# Patient Record
Sex: Female | Born: 1960 | Race: Black or African American | Hispanic: No | Marital: Single | Smoking: Current every day smoker
Health system: Southern US, Community
[De-identification: ages and names within clinical notes are randomized; demographics above are authoritative.]

## PROBLEM LIST (undated history)

## (undated) DIAGNOSIS — I10 Essential (primary) hypertension: Secondary | ICD-10-CM

## (undated) DIAGNOSIS — E119 Type 2 diabetes mellitus without complications: Secondary | ICD-10-CM

---

## 2010-03-30 ENCOUNTER — Ambulatory Visit (HOSPITAL_BASED_OUTPATIENT_CLINIC_OR_DEPARTMENT_OTHER)
Admission: RE | Admit: 2010-03-30 | Discharge: 2010-03-30 | Payer: Self-pay | Source: Home / Self Care | Attending: Unknown Physician Specialty | Admitting: Unknown Physician Specialty

## 2017-07-10 ENCOUNTER — Other Ambulatory Visit: Payer: Self-pay | Admitting: Internal Medicine

## 2017-07-10 DIAGNOSIS — N6489 Other specified disorders of breast: Secondary | ICD-10-CM

## 2017-07-13 ENCOUNTER — Ambulatory Visit
Admission: RE | Admit: 2017-07-13 | Discharge: 2017-07-13 | Disposition: A | Discharge status: Home / Self Care | Payer: Medicare Other | Source: Ambulatory Visit | Attending: Internal Medicine | Admitting: Internal Medicine

## 2017-07-13 DIAGNOSIS — N6489 Other specified disorders of breast: Secondary | ICD-10-CM

## 2019-06-24 IMAGING — MG STEREOTACTIC CORE NEEDLE BIOPSY
8 of 9 series · 8 of 17 positions shown · non-contrast
Comparison: Previous exams.

ADDENDUM:
Pathology revealed BENIGN LYMPH NODE of LEFT axilla. This was found
to be concordant by Dr. Sigfredo Minnis.

Pathology revealed COMPLEX SCLEROSING LESION of LEFT breast, upper
inner. This was found to be concordant by Dr. Sigfredo Minnis, with
excision recommended.
Pathology results will be discussed with the patient by telephone
and surgical referral will be made by Seppojuhani Malin, Breast
Navigator at [facility] [[email or web address]].
Pathology and imaging reports have been faxed to Seppojuhani Malin.
Pathology results reported by Andtzej Smirnow, RN on 07/16/2017.
CLINICAL DATA: Left-sided distortion
EXAM:
LEFT BREAST STEREOTACTIC CORE NEEDLE BIOPSY

[L (1 of 6)]
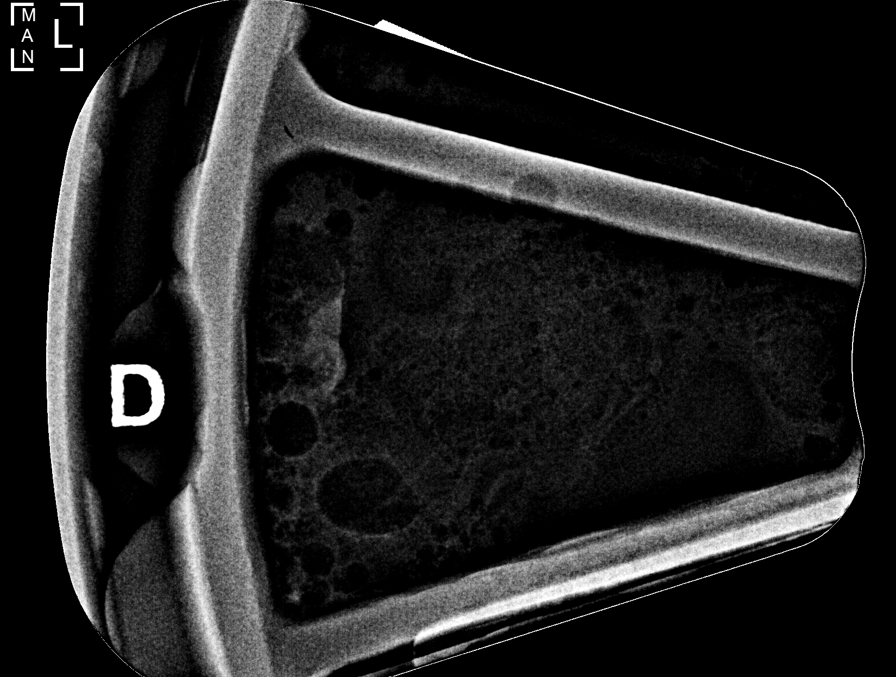

[L (2 of 6)]
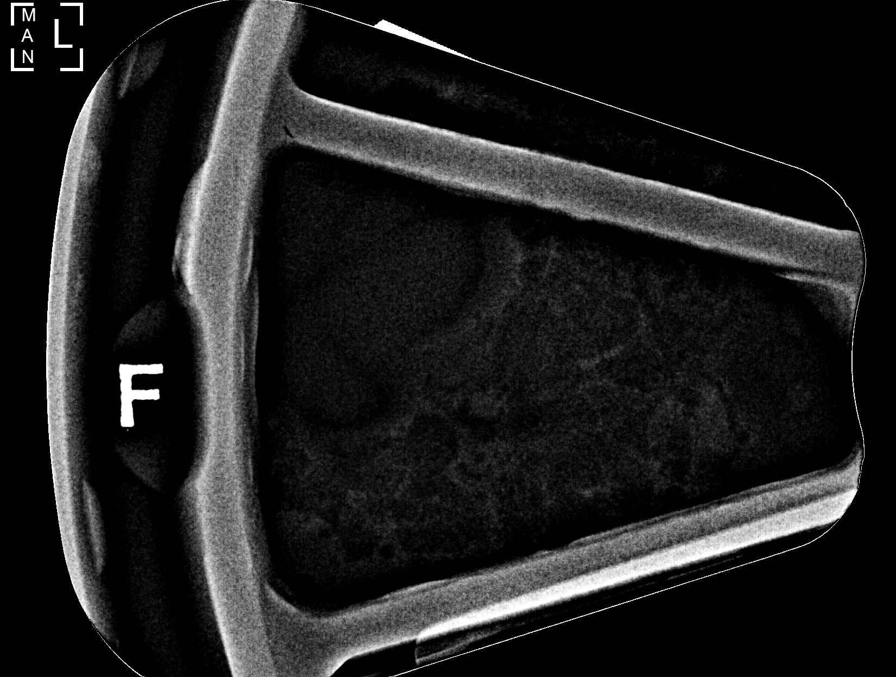

[L (3 of 6)]
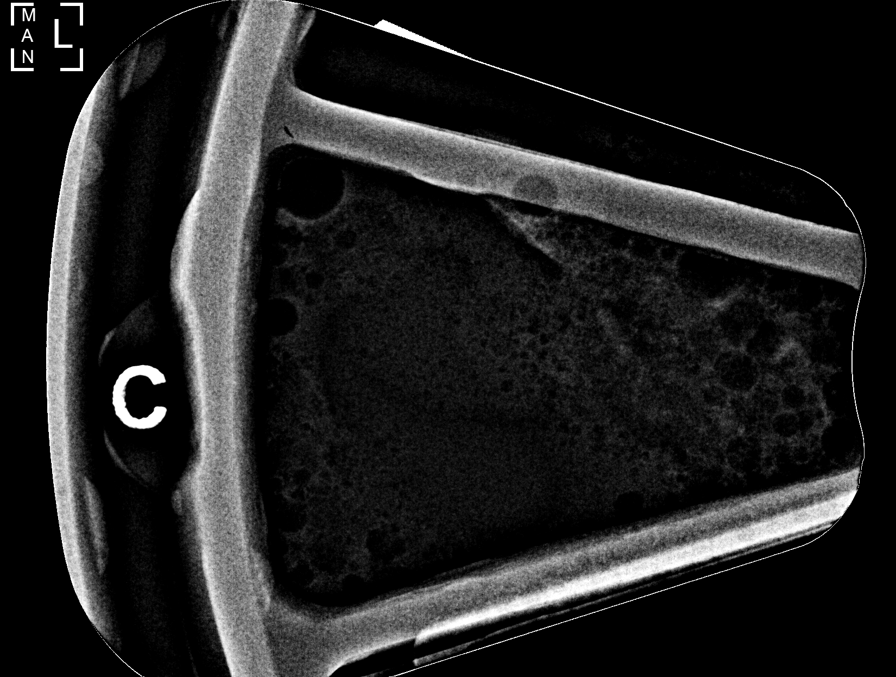

[L (4 of 6)]
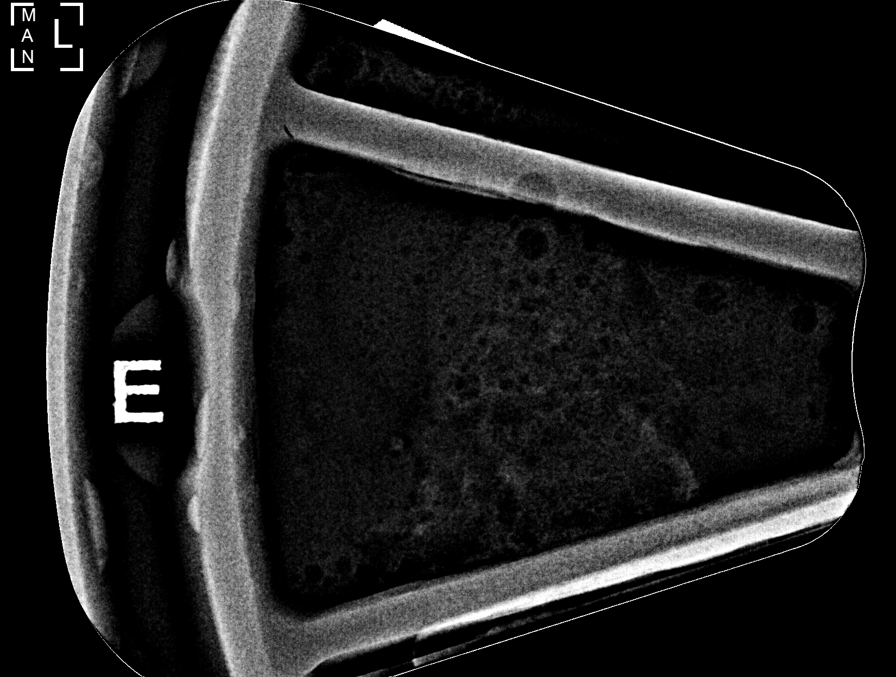

[L (5 of 6)]
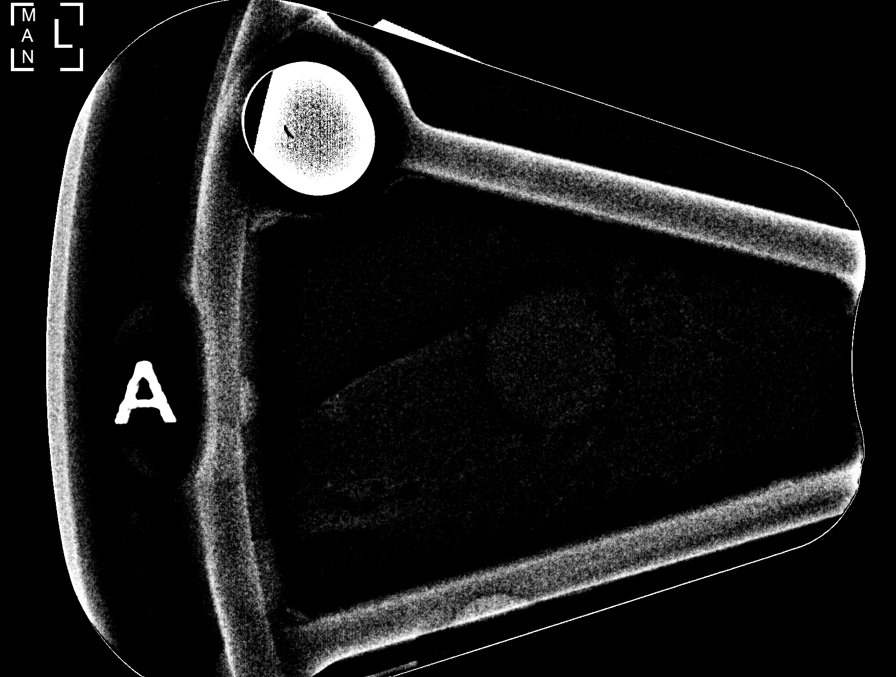

[L (6 of 6)]
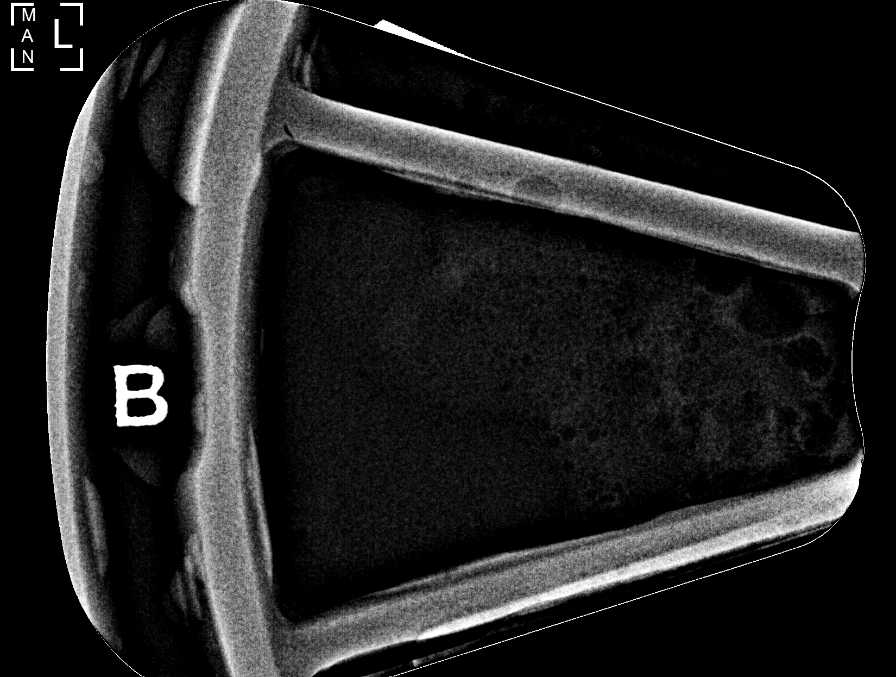

[L CC]
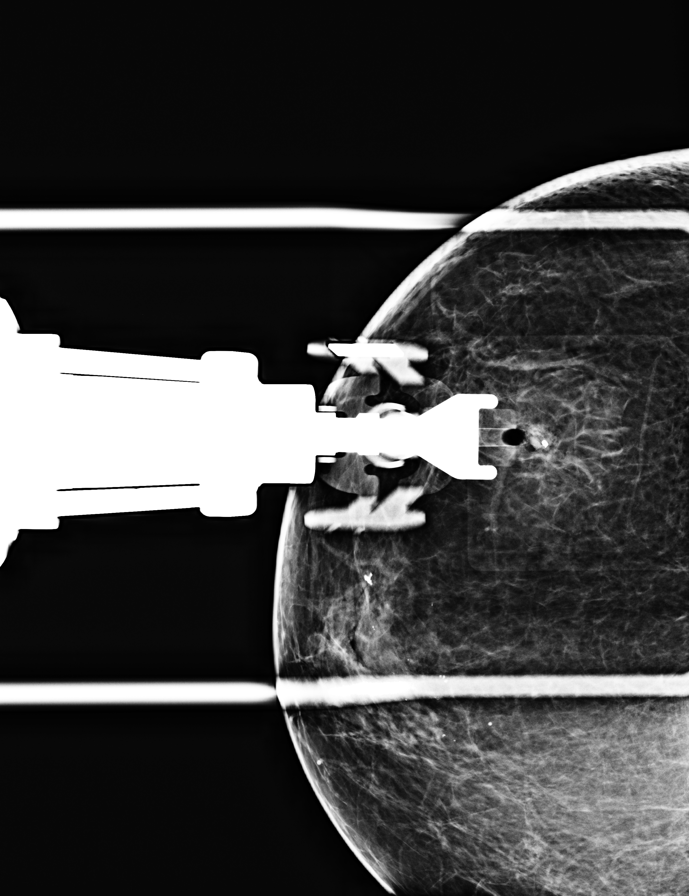

[L CC tomo · tomo slice 29/58.0]
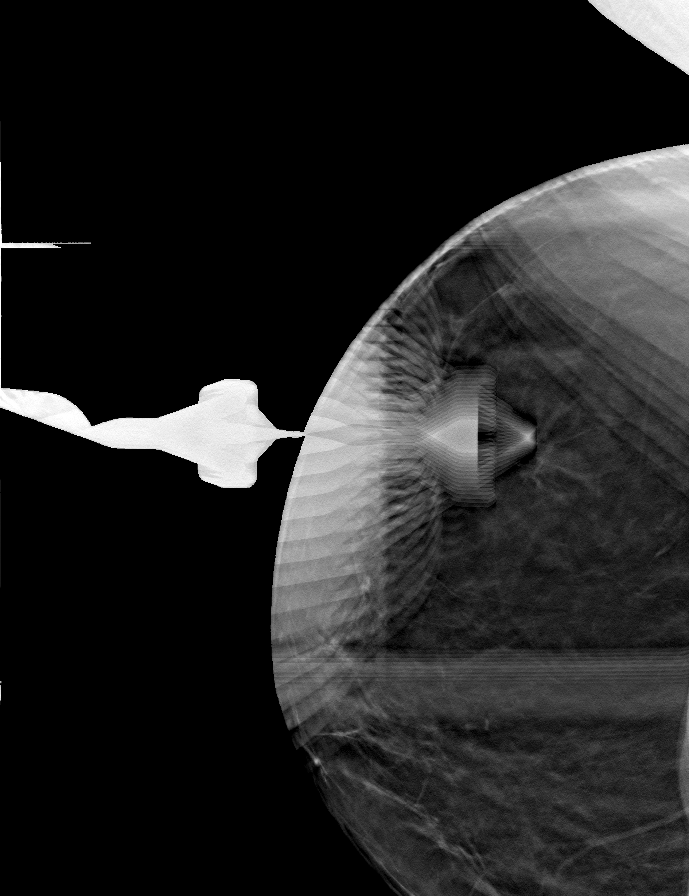

[8 of 17 positions shown; findings below may reference images not displayed]



Using sterile technique and 1% Lidocaine as local anesthetic, under
stereotactic guidance, a 9 gauge vacuum assisted device was used to
perform core needle biopsy of a linear branching asymmetry in the
left breast using a superior approach.

Lesion quadrant: Upper inner

At the conclusion of the procedure, a coil shaped tissue marker clip
was deployed into the biopsy cavity. Follow-up 2-view mammogram was
performed and dictated separately.
IMPRESSION: Stereotactic-guided biopsy of the linear branching asymmetry in the
upper inner left breast. No apparent complications.

## 2021-09-03 ENCOUNTER — Other Ambulatory Visit: Payer: Self-pay

## 2021-09-03 ENCOUNTER — Emergency Department (HOSPITAL_BASED_OUTPATIENT_CLINIC_OR_DEPARTMENT_OTHER): Payer: Medicare HMO

## 2021-09-03 ENCOUNTER — Encounter (HOSPITAL_BASED_OUTPATIENT_CLINIC_OR_DEPARTMENT_OTHER): Payer: Self-pay | Admitting: Emergency Medicine

## 2021-09-03 ENCOUNTER — Emergency Department (HOSPITAL_BASED_OUTPATIENT_CLINIC_OR_DEPARTMENT_OTHER)
Admission: EM | Admit: 2021-09-03 | Discharge: 2021-09-03 | Disposition: A | Discharge status: Home / Self Care | Payer: Medicare HMO | Attending: Emergency Medicine | Admitting: Emergency Medicine

## 2021-09-03 DIAGNOSIS — R509 Fever, unspecified: Secondary | ICD-10-CM | POA: Diagnosis not present

## 2021-09-03 DIAGNOSIS — R1084 Generalized abdominal pain: Secondary | ICD-10-CM | POA: Insufficient documentation

## 2021-09-03 DIAGNOSIS — R112 Nausea with vomiting, unspecified: Secondary | ICD-10-CM | POA: Insufficient documentation

## 2021-09-03 DIAGNOSIS — M791 Myalgia, unspecified site: Secondary | ICD-10-CM | POA: Insufficient documentation

## 2021-09-03 DIAGNOSIS — A599 Trichomoniasis, unspecified: Secondary | ICD-10-CM | POA: Diagnosis not present

## 2021-09-03 HISTORY — DX: Essential (primary) hypertension: I10

## 2021-09-03 HISTORY — DX: Type 2 diabetes mellitus without complications: E11.9

## 2021-09-03 LAB — URINALYSIS, ROUTINE W REFLEX MICROSCOPIC
Bilirubin Urine: NEGATIVE
Glucose, UA: NEGATIVE mg/dL
Ketones, ur: NEGATIVE mg/dL
Leukocytes,Ua: NEGATIVE
Nitrite: NEGATIVE
Protein, ur: >=300 mg/dL — AB
Specific Gravity, Urine: 1.025 (ref 1.005–1.030)
pH: 7.5 (ref 5.0–8.0)

## 2021-09-03 LAB — URINALYSIS, MICROSCOPIC (REFLEX)

## 2021-09-03 LAB — CBC WITH DIFFERENTIAL/PLATELET
Abs Immature Granulocytes: 0.05 10*3/uL (ref 0.00–0.07)
Basophils Absolute: 0 10*3/uL (ref 0.0–0.1)
Basophils Relative: 0 %
Eosinophils Absolute: 0 10*3/uL (ref 0.0–0.5)
Eosinophils Relative: 0 %
HCT: 45.4 % (ref 36.0–46.0)
Hemoglobin: 14.7 g/dL (ref 12.0–15.0)
Immature Granulocytes: 1 %
Lymphocytes Relative: 25 %
Lymphs Abs: 2 10*3/uL (ref 0.7–4.0)
MCH: 27.9 pg (ref 26.0–34.0)
MCHC: 32.4 g/dL (ref 30.0–36.0)
MCV: 86.3 fL (ref 80.0–100.0)
Monocytes Absolute: 0.2 10*3/uL (ref 0.1–1.0)
Monocytes Relative: 3 %
Neutro Abs: 5.8 10*3/uL (ref 1.7–7.7)
Neutrophils Relative %: 71 %
Platelets: 216 10*3/uL (ref 150–400)
RBC: 5.26 MIL/uL — ABNORMAL HIGH (ref 3.87–5.11)
RDW: 16.4 % — ABNORMAL HIGH (ref 11.5–15.5)
WBC: 8.1 10*3/uL (ref 4.0–10.5)
nRBC: 0 % (ref 0.0–0.2)

## 2021-09-03 LAB — COMPREHENSIVE METABOLIC PANEL
ALT: 15 U/L (ref 0–44)
AST: 19 U/L (ref 15–41)
Albumin: 4.3 g/dL (ref 3.5–5.0)
Alkaline Phosphatase: 63 U/L (ref 38–126)
Anion gap: 10 (ref 5–15)
BUN: 13 mg/dL (ref 8–23)
CO2: 23 mmol/L (ref 22–32)
Calcium: 9.3 mg/dL (ref 8.9–10.3)
Chloride: 103 mmol/L (ref 98–111)
Creatinine, Ser: 0.8 mg/dL (ref 0.44–1.00)
GFR, Estimated: >60 mL/min (ref >60)
Glucose, Bld: 157 mg/dL — ABNORMAL HIGH (ref 70–99)
Potassium: 3.7 mmol/L (ref 3.5–5.1)
Sodium: 136 mmol/L (ref 135–145)
Total Bilirubin: 0.7 mg/dL (ref 0.3–1.2)
Total Protein: 9 g/dL — ABNORMAL HIGH (ref 6.5–8.1)

## 2021-09-03 LAB — LIPASE, BLOOD: Lipase: 55 U/L — ABNORMAL HIGH (ref 11–51)

## 2021-09-03 MED ORDER — ONDANSETRON HCL 4 MG PO TABS: 4.0000 mg | ORAL_TABLET | Freq: Four times a day (QID) | ORAL | 0 refills | Status: AC

## 2021-09-03 MED ORDER — SODIUM CHLORIDE 0.9 % IV BOLUS
1000.0000 mL | Freq: Once | INTRAVENOUS | Status: AC
  Administered 2021-09-03: 1000 mL via INTRAVENOUS

## 2021-09-03 MED ORDER — METRONIDAZOLE 500 MG PO TABS
2000.0000 mg | ORAL_TABLET | Freq: Once | ORAL | Status: AC
  Administered 2021-09-03: 2000 mg via ORAL
  Filled 2021-09-03: qty 4

## 2021-09-03 MED ORDER — IOHEXOL 300 MG/ML  SOLN
100.0000 mL | Freq: Once | INTRAMUSCULAR | Status: AC | PRN
  Administered 2021-09-03: 100 mL via INTRAVENOUS

## 2021-09-03 MED ORDER — DOXYCYCLINE HYCLATE 100 MG PO TABS
100.0000 mg | ORAL_TABLET | Freq: Once | ORAL | Status: AC
  Administered 2021-09-03: 100 mg via ORAL
  Filled 2021-09-03: qty 1

## 2021-09-03 MED ORDER — DOXYCYCLINE HYCLATE 100 MG PO CAPS: 100.0000 mg | ORAL_CAPSULE | Freq: Two times a day (BID) | ORAL | 0 refills | Status: AC

## 2021-09-03 MED ORDER — FENTANYL CITRATE PF 50 MCG/ML IJ SOSY
50.0000 ug | PREFILLED_SYRINGE | Freq: Once | INTRAMUSCULAR | Status: AC
  Administered 2021-09-03: 50 ug via INTRAVENOUS
  Filled 2021-09-03: qty 1

## 2021-09-03 MED ORDER — CEFTRIAXONE SODIUM 500 MG IJ SOLR
500.0000 mg | Freq: Once | INTRAMUSCULAR | Status: AC
  Administered 2021-09-03: 500 mg via INTRAMUSCULAR
  Filled 2021-09-03: qty 500

## 2021-09-03 MED ORDER — ONDANSETRON HCL 4 MG/2ML IJ SOLN
4.0000 mg | Freq: Once | INTRAMUSCULAR | Status: AC
  Administered 2021-09-03: 4 mg via INTRAVENOUS
  Filled 2021-09-03: qty 2

## 2021-09-03 NOTE — Discharge Instructions (Signed)
Take next dose antibiotic tomorrow morning.  Take Zofran as needed for nausea and vomiting.

## 2021-09-03 NOTE — ED Notes (Signed)
Portable xray at bedside.

## 2021-09-03 NOTE — ED Triage Notes (Signed)
Pt arrives pov, to triage in wheelchair, c/o lower abdominal pain with n/v and chills since 0300 today

## 2021-09-03 NOTE — ED Provider Notes (Signed)
[facility] EMERGENCY DEPARTMENT Provider Note   CSN: [id number] Arrival date & time: 09/03/21  1501     History  Chief Complaint  Patient presents with   Abdominal Pain    Deborah Allen is a 61 y.o. female.  Here with lower abdominal pain for the last 12 hours with nausea and vomiting.  No diarrhea.  No pain with urination.  History of TAVR, heart block, she thinks cholecystectomy.  This nothing has made symptoms worse or better.  Denies any suspicious food intake.  Denies any chest pain or shortness of breath.  She has had fevers and chills and body aches.  She has not checked her temperature at home.  She has not taken any medicine for this.  She has not have any other issues at this time.  The history is provided by the patient.       Home Medications Prior to Admission medications   Not on File      Allergies    Statins and Penicillins    Review of Systems   Review of Systems  Physical Exam Updated Vital Signs BP (!) 190/101   Pulse 91   Temp 98.1 F (36.7 C) (Oral)   Resp (!) 21   Ht 5\' 4"  (1.626 m)   Wt 97.5 kg   SpO2 98%   BMI 36.90 kg/m  Physical Exam Vitals and nursing note reviewed.  Constitutional:      General: She is not in acute distress.    Appearance: She is well-developed. She is ill-appearing.  HENT:     Head: Normocephalic and atraumatic.     Mouth/Throat:     Mouth: Mucous membranes are moist.  Eyes:     Extraocular Movements: Extraocular movements intact.     Conjunctiva/sclera: Conjunctivae normal.     Pupils: Pupils are equal, round, and reactive to light.  Cardiovascular:     Rate and Rhythm: Normal rate and regular rhythm.     Heart sounds: Normal heart sounds. No murmur heard. Pulmonary:     Effort: Pulmonary effort is normal. No respiratory distress.     Breath sounds: Normal breath sounds.  Abdominal:     Palpations: Abdomen is soft.     Tenderness: There is abdominal tenderness in the right lower quadrant,  suprapubic area and left lower quadrant.  Musculoskeletal:        General: No swelling.     Cervical back: Neck supple.  Skin:    General: Skin is warm and dry.     Capillary Refill: Capillary refill takes less than 2 seconds.  Neurological:     Mental Status: She is alert.  Psychiatric:        Mood and Affect: Mood normal.     ED Results / Procedures / Treatments   Labs (all labs ordered are listed, but only abnormal results are displayed) Labs Reviewed  CBC WITH DIFFERENTIAL/PLATELET - Abnormal; Notable for the following components:      Result Value   RBC 5.26 (*)    RDW 16.4 (*)    All other components within normal limits  COMPREHENSIVE METABOLIC PANEL - Abnormal; Notable for the following components:   Glucose, Bld 157 (*)    Total Protein 9.0 (*)    All other components within normal limits  LIPASE, BLOOD - Abnormal; Notable for the following components:   Lipase 55 (*)    All other components within normal limits  URINALYSIS, ROUTINE W REFLEX MICROSCOPIC - Abnormal; Notable for  the following components:   Hgb urine dipstick SMALL (*)    Protein, ur >=300 (*)    All other components within normal limits  URINALYSIS, MICROSCOPIC (REFLEX) - Abnormal; Notable for the following components:   Bacteria, UA FEW (*)    Trichomonas, UA PRESENT (*)    All other components within normal limits  URINE CULTURE    EKG EKG Interpretation  Date/Time:  Saturday September 03 2021 15:30:09 EDT Ventricular Rate:  89 PR Interval:  214 QRS Duration: 155 QT Interval:  435 QTC Calculation: 530 R Axis:   93 Text Interpretation: Sinus rhythm Borderline prolonged PR interval Probable left atrial enlargement Left bundle branch block Confirmed by Virgina Norfolk ([id number]) on 09/03/2021 3:38:05 PM  Radiology CT ABDOMEN PELVIS W CONTRAST  Result Date: 09/03/2021 CLINICAL DATA:  Abdominal pain EXAM: CT ABDOMEN AND PELVIS WITH CONTRAST TECHNIQUE: Multidetector CT imaging of the abdomen and pelvis  was performed using the standard protocol following bolus administration of intravenous contrast. RADIATION DOSE REDUCTION: This exam was performed according to the departmental dose-optimization program which includes automated exposure control, adjustment of the mA and/or kV according to patient size and/or use of iterative reconstruction technique. CONTRAST:  OMNIPAQUE IOHEXOL 300 MG/ML  SOLN COMPARISON:  CT abdomen and pelvis dated September 15, 2020 FINDINGS: Lower chest: Small hiatal hernia.  No acute abnormality. Hepatobiliary: No focal liver abnormality is seen. No gallstones, gallbladder wall thickening, or biliary dilatation. Pancreas: Unremarkable. No pancreatic ductal dilatation or surrounding inflammatory changes. Spleen: Normal in size without focal abnormality. Adrenals/Urinary Tract: Unchanged left adrenal gland nodule measuring 2.0 cm which was low attenuation on prior noncontrast exam, compatible with benign adenoma. No hydronephrosis or nephrolithiasis. Bladder is unremarkable. Stomach/Bowel: Stomach is within normal limits. No evidence of bowel wall thickening, distention, or inflammatory changes. Vascular/Lymphatic: Aortic atherosclerosis. No enlarged abdominal or pelvic lymph nodes. Reproductive: No adnexal masses. Other: No abdominal wall hernia or abnormality. No abdominopelvic ascites. Musculoskeletal: Unchanged mild compression deformity of the L1 vertebral body. No aggressive appearing osseous lesions. IMPRESSION: 1. No acute findings in the abdomen or pelvis. 2.  Aortic Atherosclerosis (ICD10-I70.0). Electronically Signed   By: Allegra Lai M.D.   On: 09/03/2021 17:26   DG Chest Portable 1 View  Result Date: 09/03/2021 CLINICAL DATA:  Pt arrives pov, to triage in wheelchair, c/o lower abdominal pain with n/v and chills since 0300 today EXAM: PORTABLE CHEST 1 VIEW COMPARISON:  Chest x-ray 09/15/2020 FINDINGS: Left chest wall 2 lead pacemaker in grossly similar position. The heart  and mediastinal contours are unchanged. Aortic calcification. Aortic valve replacement. No focal consolidation. No pulmonary edema. No definite pleural effusion. No pneumothorax. No acute osseous abnormality. IMPRESSION: 1. No active disease. Consider lateral view for confirmation of no pleural effusion. 2.  Aortic Atherosclerosis (ICD10-I70.0). Electronically Signed   By: Tish Frederickson M.D.   On: 09/03/2021 15:58    Procedures Procedures    Medications Ordered in ED Medications  metroNIDAZOLE (FLAGYL) tablet 2,000 mg (has no administration in time range)  doxycycline (VIBRA-TABS) tablet 100 mg (has no administration in time range)  sodium chloride 0.9 % bolus 1,000 mL (0 mLs Intravenous Stopped 09/03/21 1653)  ondansetron (ZOFRAN) injection 4 mg (4 mg Intravenous Given 09/03/21 1535)  fentaNYL (SUBLIMAZE) injection 50 mcg (50 mcg Intravenous Given 09/03/21 1536)  iohexol (OMNIPAQUE) 300 MG/ML solution 100 mL (100 mLs Intravenous Contrast Given 09/03/21 1659)  cefTRIAXone (ROCEPHIN) injection 500 mg (500 mg Intramuscular Given 09/03/21 1803)    ED Course/  Medical Decision Making/ A&P                           Medical Decision Making Amount and/or Complexity of Data Reviewed Labs: ordered. Radiology: ordered.  Risk Prescription drug management.   Jhoselyn Ruffini is here with abdominal pain, fever, chills.  Patient also with nausea and vomiting.  Patient with elevated blood pressure but otherwise normal vitals.  Lower abdominal pain, nausea, vomiting for the last 12 hours.  Thinks she has had a history of gallbladder removal.  Has a history of diabetes, hypertension, TAVR, pacemaker.  EKG upon arrival shows left bundle branch with sinus rhythm.  No ischemic changes.  She denies any chest pain or shortness of breath.  Denies any suspicious food intake.  Denies any pain with urination.  Differential diagnosis is colitis versus less likely bowel obstruction, appendicitis, diverticulitis, UTI.   Could be foodborne illness or viral process.  We will get CBC, CMP, lipase, urinalysis, CT scan abdomen pelvis.  Will give IV fluids, IV fentanyl and IV Zofran and reevaluate.  Upon reevaluation she has no pain or nausea.  She is feeling better.  Per my review interpretation of labs there is no significant anemia, electrolyte abnormality, kidney injury, leukocytosis.  Urinalysis negative for infection.  However did see trichomonas in the urine.  Patient states that she has not had sex in 10 years.  She does not have any vaginal discharge.  Overall CT scan of the abdomen and pelvis shows no acute findings.  No abscess.  Overall, trichomonas is positive and will empirically treat for gonorrhea and chlamydia and trichomonas despite patient being fairly asymptomatic without discharge and without any recent sexual encounters.  She does have a low allergy to penicillin but seems like she does not have any contraindications to give a dose of Rocephin.  We will give her Rocephin here and then treat with doxycycline and Flagyl.  Will prescribe Zofran.  I have no concern for PID given no fever, no white count, no abnormalities on CT scan and overall no vaginal discharge or ongoing discomfort.  I did offer patient a pelvic exam to do swabs for gonorrhea and chlamydia and to further evaluate her cervix but she did not want that.  I think that is reasonable.  We will just do empiric treatment.  Have her follow-up with her primary care doctor.    Patient tolerated antibiotics well.  Discharged with prescription for doxycycline.  This chart was dictated using voice recognition software.  Despite best efforts to proofread,  errors can occur which can change the documentation meaning.         Final Clinical Impression(s) / ED Diagnoses Final diagnoses:  Generalized abdominal pain  Trichomonas infection    Rx / DC Orders ED Discharge Orders     None         Virgina Norfolk, DO 09/03/21 1833

## 2021-09-05 LAB — URINE CULTURE

## 2023-08-15 IMAGING — CT CT ABD-PELV W/ CM
2 of 5 series · 17 of 46 positions shown, 19 images · IV contrast (Omnipaque)
Comparison: CT abdomen and pelvis dated September 15, 2020

CLINICAL DATA: Abdominal pain

EXAM:
CT ABDOMEN AND PELVIS WITH CONTRAST
TECHNIQUE: Multidetector CT imaging of the abdomen and pelvis was performed
using the standard protocol following bolus administration of
intravenous contrast.

[Series 2: axial st · axial · 0.94mm/px · z∈[-351,+9]mm · 14 of 82 slices shown, 16 images]
[im 5/82  soft-tissue]
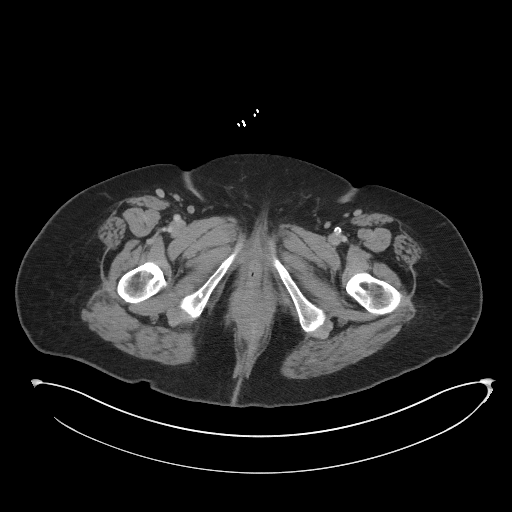
[im 5/82  bone]
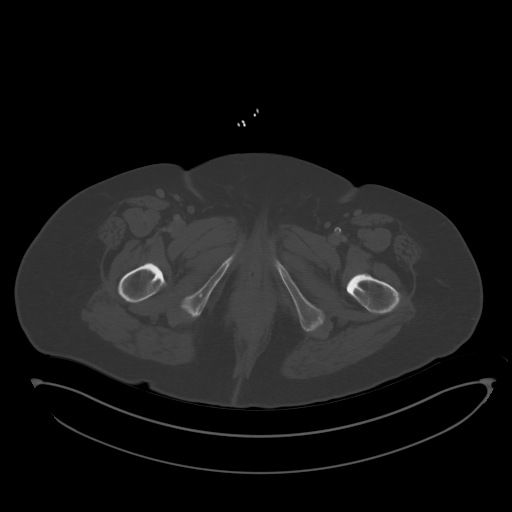
[im 13/82  soft-tissue]
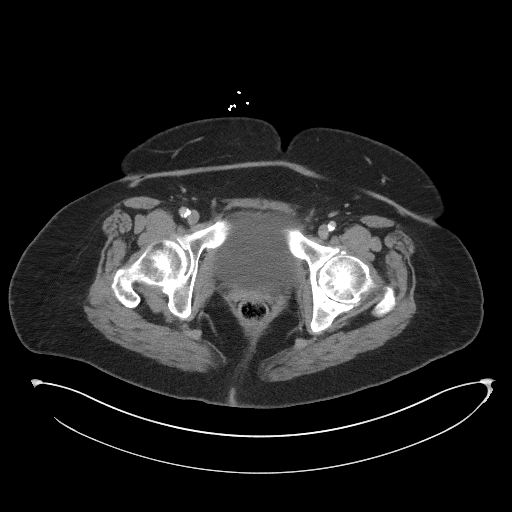
[im 17/82  soft-tissue]
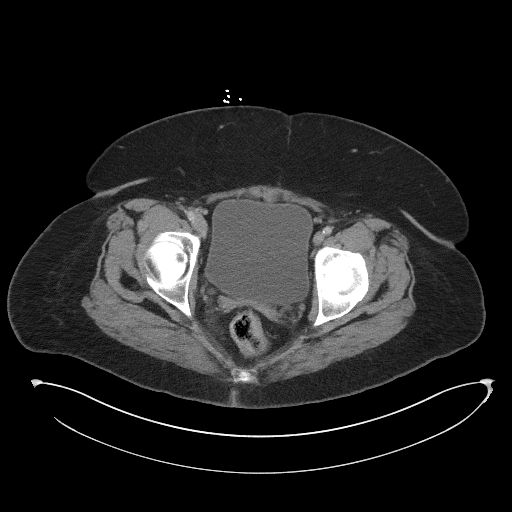
[im 21/82  soft-tissue]
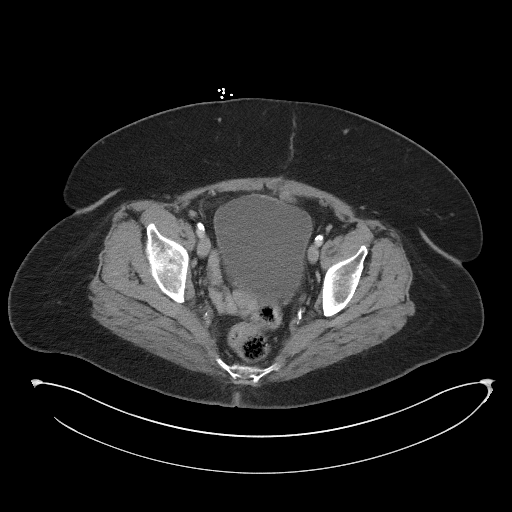
[im 29/82  soft-tissue]
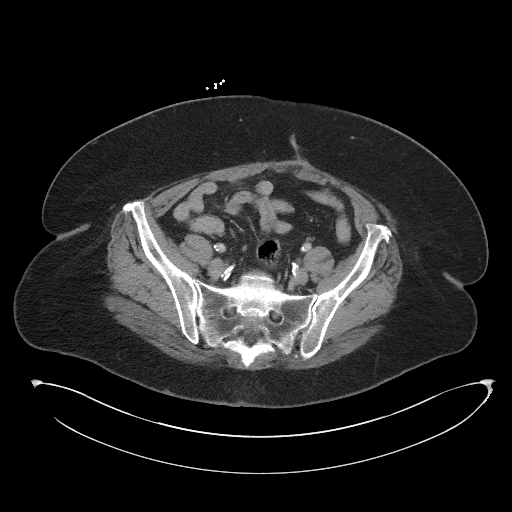
[im 33/82  soft-tissue]
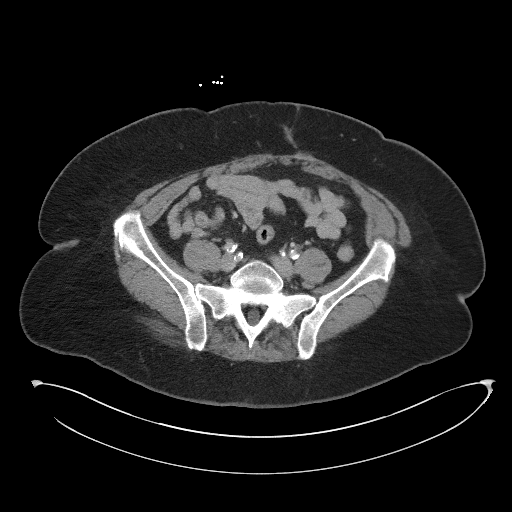
[im 37/82  soft-tissue]
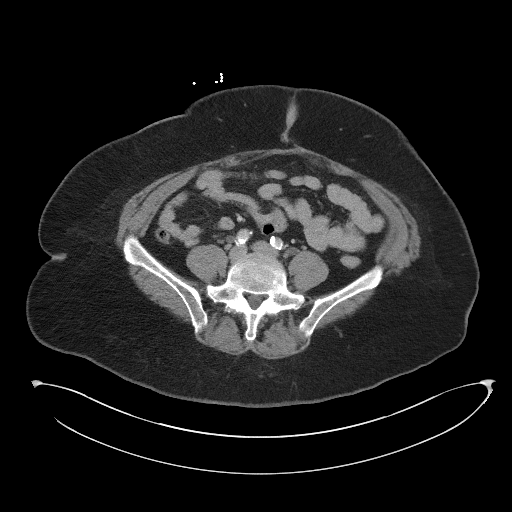
[im 45/82  soft-tissue]
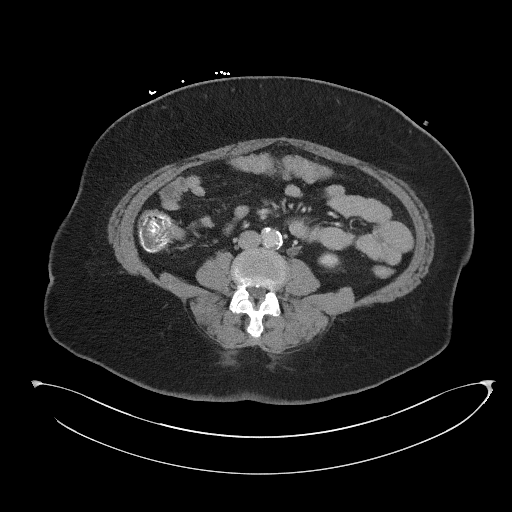
[im 49/82  soft-tissue]
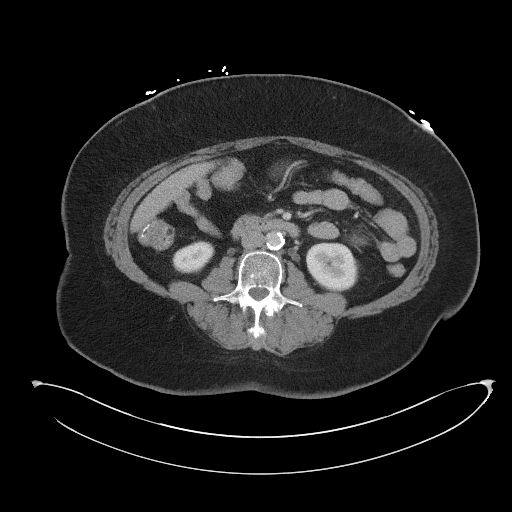
[im 49/82  bone]
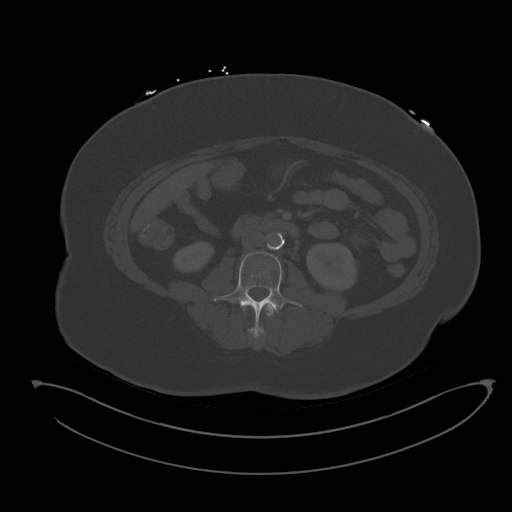
[im 53/82  soft-tissue]
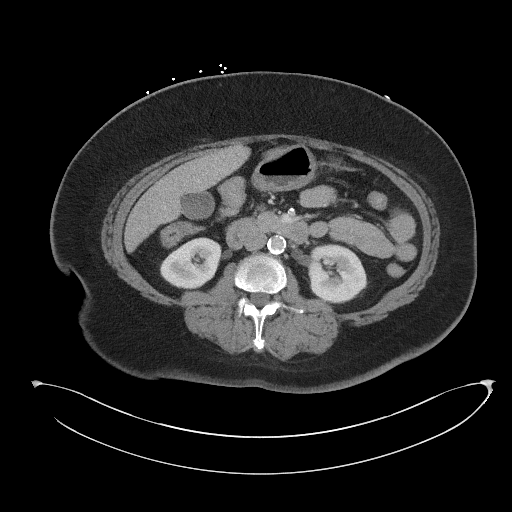
[im 61/82  soft-tissue]
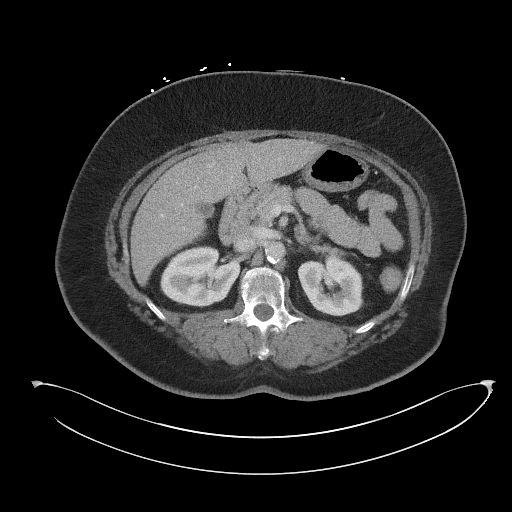
[im 65/82  soft-tissue]
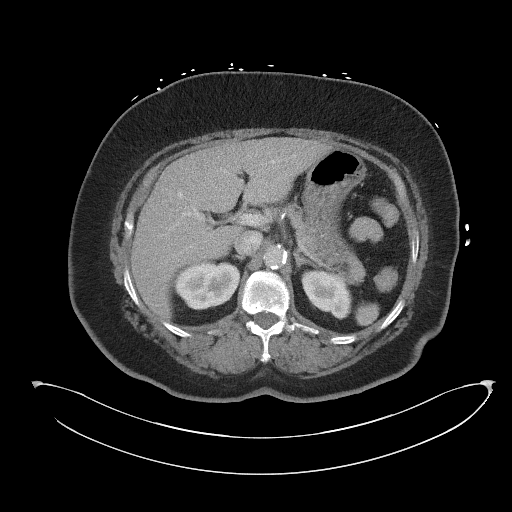
[im 69/82  soft-tissue]
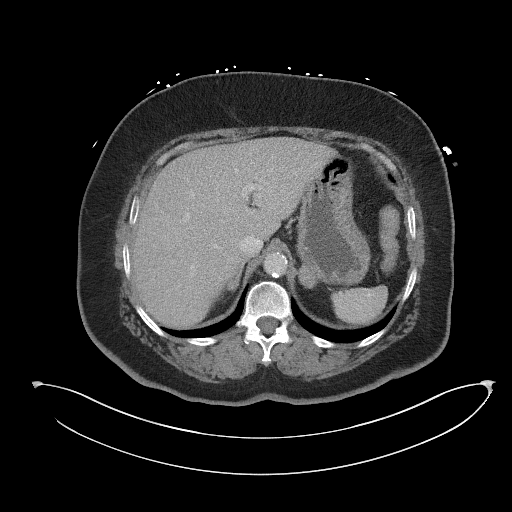
[im 77/82  soft-tissue]
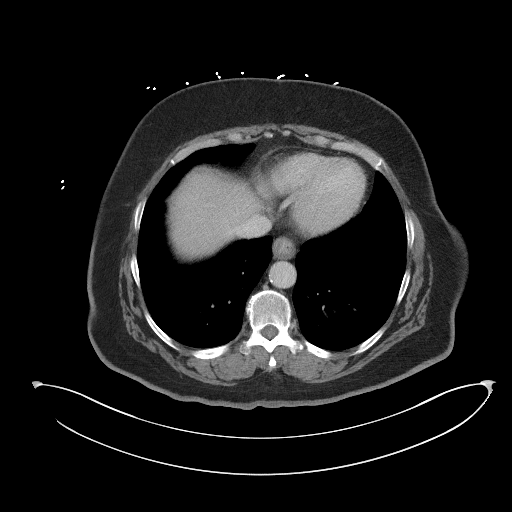

[Series 5: coronal st · coronal · 0.83mm/px · 3 of 114 slices shown]
[im 38/114  soft-tissue]
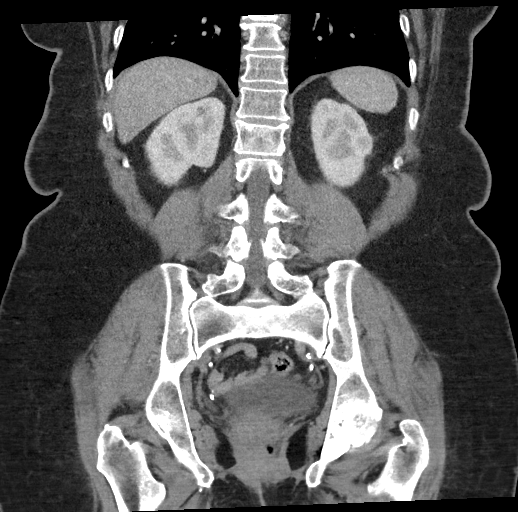
[im 51/114  soft-tissue]
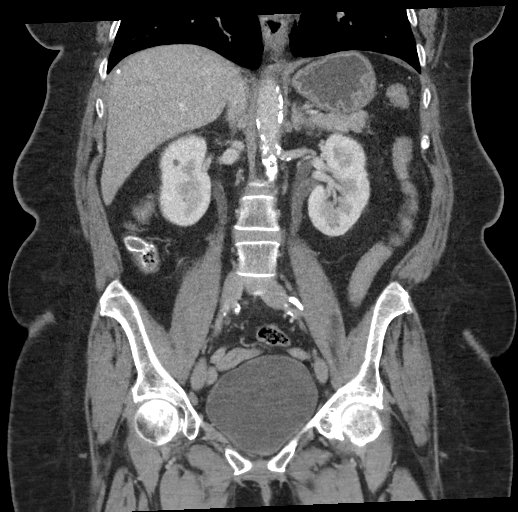
[im 63/114  soft-tissue]
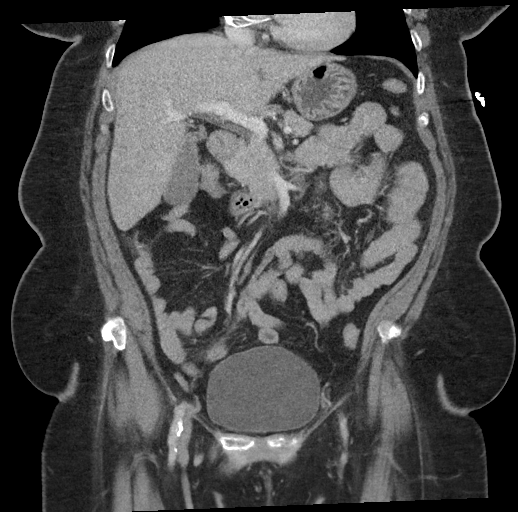

[17 of 46 positions shown; findings below may reference images not displayed]

RADIATION DOSE REDUCTION: This exam was performed according to the
departmental dose-optimization program which includes automated
exposure control, adjustment of the mA and/or kV according to
patient size and/or use of iterative reconstruction technique.

CONTRAST:  100mL OMNIPAQUE IOHEXOL 300 MG/ML  SOLN
FINDINGS: Lower chest: Small hiatal hernia.  No acute abnormality.

Hepatobiliary: No focal liver abnormality is seen. No gallstones,
gallbladder wall thickening, or biliary dilatation.

Pancreas: Unremarkable. No pancreatic ductal dilatation or
surrounding inflammatory changes.

Spleen: Normal in size without focal abnormality.

Adrenals/Urinary Tract: Unchanged left adrenal gland nodule
measuring 2.0 cm which was low attenuation on prior noncontrast
exam, compatible with benign adenoma. No hydronephrosis or
nephrolithiasis. Bladder is unremarkable.

Stomach/Bowel: Stomach is within normal limits. No evidence of bowel
wall thickening, distention, or inflammatory changes.

Vascular/Lymphatic: Aortic atherosclerosis. No enlarged abdominal or
pelvic lymph nodes.

Reproductive: No adnexal masses.

Other: No abdominal wall hernia or abnormality. No abdominopelvic
ascites.

Musculoskeletal: Unchanged mild compression deformity of the L1
vertebral body. No aggressive appearing osseous lesions.
IMPRESSION: 1. No acute findings in the abdomen or pelvis.
2.  Aortic Atherosclerosis ([id number]).
